# Patient Record
Sex: Female | Born: 1935 | Race: White | Hispanic: No | Smoking: Never smoker
Health system: Southern US, Community
[De-identification: ages and names within clinical notes are randomized; demographics above are authoritative.]

## PROBLEM LIST (undated history)

## (undated) DIAGNOSIS — I10 Essential (primary) hypertension: Secondary | ICD-10-CM

---

## 2019-12-23 ENCOUNTER — Other Ambulatory Visit: Payer: Self-pay

## 2019-12-23 ENCOUNTER — Emergency Department (HOSPITAL_COMMUNITY)
Admission: EM | Admit: 2019-12-23 | Discharge: 2019-12-23 | Disposition: A | Discharge status: Home / Self Care | Payer: Medicare Other | Attending: Emergency Medicine | Admitting: Emergency Medicine

## 2019-12-23 ENCOUNTER — Encounter (HOSPITAL_COMMUNITY): Payer: Self-pay

## 2019-12-23 ENCOUNTER — Emergency Department (HOSPITAL_COMMUNITY): Payer: Medicare Other

## 2019-12-23 DIAGNOSIS — S4992XA Unspecified injury of left shoulder and upper arm, initial encounter: Secondary | ICD-10-CM | POA: Diagnosis present

## 2019-12-23 DIAGNOSIS — Y929 Unspecified place or not applicable: Secondary | ICD-10-CM | POA: Insufficient documentation

## 2019-12-23 DIAGNOSIS — W1839XA Other fall on same level, initial encounter: Secondary | ICD-10-CM | POA: Insufficient documentation

## 2019-12-23 DIAGNOSIS — Y999 Unspecified external cause status: Secondary | ICD-10-CM | POA: Insufficient documentation

## 2019-12-23 DIAGNOSIS — S42202A Unspecified fracture of upper end of left humerus, initial encounter for closed fracture: Secondary | ICD-10-CM | POA: Insufficient documentation

## 2019-12-23 DIAGNOSIS — T1490XA Injury, unspecified, initial encounter: Secondary | ICD-10-CM

## 2019-12-23 DIAGNOSIS — Y9389 Activity, other specified: Secondary | ICD-10-CM | POA: Insufficient documentation

## 2019-12-23 HISTORY — DX: Essential (primary) hypertension: I10

## 2019-12-23 MED ORDER — ACETAMINOPHEN 500 MG PO TABS
1000.0000 mg | ORAL_TABLET | Freq: Once | ORAL | Status: AC
  Administered 2019-12-23: 1000 mg via ORAL
  Filled 2019-12-23: qty 2

## 2019-12-23 MED ORDER — DICLOFENAC SODIUM 1 % EX GEL: 4.0000 g | Freq: Four times a day (QID) | CUTANEOUS | 0 refills | Status: AC

## 2019-12-23 NOTE — ED Provider Notes (Signed)
[facility] DEPT Provider Note   CSN: [id number] Arrival date & time: 12/23/19  1611     History Chief Complaint  Patient presents with  . Fall    Left Shoulder Pain     Joan Porter is a 86 y.o. female.  86 yo F with a chief complaints of a nonsyncopal fall.  The patient was reaching to pick up a towel at the bottom of her shower and she lost balance and struck her left shoulder against the side of the shower.  Denies head injury denies loss of consciousness denies neck pain back pain chest pain abdominal pain or other extremity pain.  Normally ambulates with a walker but was unable to due to the pain to her left shoulder.  Describes it as sharp.  States is not too bad.  The history is provided by the patient.  Fall This is a new problem. The current episode started yesterday. The problem occurs constantly. The problem has not changed since onset.Pertinent negatives include no chest pain, no headaches and no shortness of breath. Nothing aggravates the symptoms. Nothing relieves the symptoms. She has tried nothing for the symptoms. The treatment provided no relief.       Past Medical History:  Diagnosis Date  . Hypertension     There are no problems to display for this patient.   History reviewed. No pertinent surgical history.   OB History   No obstetric history on file.     No family history on file.  Social History   Tobacco Use  . Smoking status: Not on file  Substance Use Topics  . Alcohol use: Not on file  . Drug use: Not on file    Home Medications Prior to Admission medications   Medication Sig Start Date End Date Taking? Authorizing Provider  diclofenac Sodium (VOLTAREN) 1 % GEL Apply 4 g topically 4 (four) times daily. 12/23/19   Deno Etienne, DO    Allergies    Patient has no allergy information on record.  Review of Systems   Review of Systems  Constitutional: Negative for chills and fever.  HENT: Negative for  congestion and rhinorrhea.   Eyes: Negative for redness and visual disturbance.  Respiratory: Negative for shortness of breath and wheezing.   Cardiovascular: Negative for chest pain and palpitations.  Gastrointestinal: Negative for nausea and vomiting.  Genitourinary: Negative for dysuria and urgency.  Musculoskeletal: Positive for arthralgias. Negative for myalgias.  Skin: Negative for pallor and wound.  Neurological: Negative for dizziness and headaches.    Physical Exam Updated Vital Signs BP (!) 152/103   Pulse 87   Resp 18   SpO2 94%   Physical Exam Vitals and nursing note reviewed.  Constitutional:      General: She is not in acute distress.    Appearance: She is well-developed. She is not diaphoretic.  HENT:     Head: Normocephalic and atraumatic.  Eyes:     Pupils: Pupils are equal, round, and reactive to light.  Cardiovascular:     Rate and Rhythm: Normal rate and regular rhythm.     Heart sounds: No murmur. No friction rub. No gallop.   Pulmonary:     Effort: Pulmonary effort is normal.     Breath sounds: No wheezing or rales.  Abdominal:     General: There is no distension.     Palpations: Abdomen is soft.     Tenderness: There is no abdominal tenderness.  Musculoskeletal:  General: Tenderness present.     Cervical back: Normal range of motion and neck supple.     Comments: Tenderness of the proximal humerus.  Full range of motion of the elbow without pain.  Pulse motor and sensation intact distally.  No clavicular tenderness.  Skin:    General: Skin is warm and dry.  Neurological:     Mental Status: She is alert and oriented to person, place, and time.  Psychiatric:        Behavior: Behavior normal.     ED Results / Procedures / Treatments   Labs (all labs ordered are listed, but only abnormal results are displayed) Labs Reviewed - No data to display  EKG None  Radiology DG Shoulder Left  Result Date: 12/23/2019 CLINICAL DATA:  Larey Seat, left  arm pain EXAM: LEFT SHOULDER - 2+ VIEW COMPARISON:  None. FINDINGS: Internal rotation, external rotation, and transscapular views of the left shoulder demonstrate a comminuted oblique impacted fracture of the humeral neck. There is no dislocation. Overlying soft tissue swelling. Left chest is clear. IMPRESSION: 1. Comminuted impacted left humeral neck fracture. Electronically Signed   By: Sharlet Salina M.D.   On: 12/23/2019 17:17   DG Humerus Left  Result Date: 12/23/2019 CLINICAL DATA:  Fell, pain EXAM: LEFT HUMERUS - 2+ VIEW COMPARISON:  None. FINDINGS: Frontal and lateral views of the left humerus are obtained. There is an oblique comminuted impacted fracture of the proximal humeral neck. No dislocation. Remaining portions of the left humerus are unremarkable. The soft tissues are normal. IMPRESSION: 1. Comminuted impacted left humeral neck fracture. Electronically Signed   By: Sharlet Salina M.D.   On: 12/23/2019 17:18    Procedures Procedures (including critical care time)  Medications Ordered in ED Medications  acetaminophen (TYLENOL) tablet 1,000 mg (has no administration in time range)    ED Course  I have reviewed the triage vital signs and the nursing notes.  Pertinent labs & imaging results that were available during my care of the patient were reviewed by me and considered in my medical decision making (see chart for details).    MDM Rules/Calculators/A&P                      86 yo F with a chief complaints of left arm pain.  Nonsyncopal fall.  No other injuries.  Plain film viewed by me with a proximal humerus fracture.  Placed in a sling.  Ortho follow-up.  Discussed mobility at home.  Patient already has a wheelchair.  Plain film is consistent with my initial most likely diagnosis.  Viewed on x-ray by me.  Some displacement is noticed.  Will place in a sling.  Orthopedic follow-up.  5:39 PM:  I have discussed the diagnosis/risks/treatment options with the patient and  believe the pt to be eligible for discharge home to follow-up with PCP. We also discussed returning to the ED immediately if new or worsening sx occur. We discussed the sx which are most concerning (e.g., sudden worsening pain, fever, inability to tolerate by mouth) that necessitate immediate return. Medications administered to the patient during their visit and any new prescriptions provided to the patient are listed below.  Medications given during this visit Medications  acetaminophen (TYLENOL) tablet 1,000 mg (has no administration in time range)     The patient appears reasonably screen and/or stabilized for discharge and I doubt any other medical condition or other [facility] requiring further screening, evaluation, or treatment in the ED  at this time prior to discharge.   Final Clinical Impression(s) / ED Diagnoses Final diagnoses:  Closed fracture of proximal end of left humerus, unspecified fracture morphology, initial encounter    Rx / DC Orders ED Discharge Orders         Ordered    diclofenac Sodium (VOLTAREN) 1 % GEL  4 times daily     12/23/19 1738           Melene Plan, DO 12/23/19 1740

## 2019-12-23 NOTE — Progress Notes (Addendum)
TOC CM received referral to arrange [facility]. TOC CM spoke to pt's dtr, Demita Odem. Offered choice for [facility]. Requesting [facility]. Referral sent to [facility] for HH. ED provider updated and orders requested. Isidoro Donning RN CCM, [facility] ED TOC CM [phone]  12/24/2019 2:18 pm [facility] declined referral. Referral sent to [facility] for HHPT. Isidoro Donning RN CCM, [facility] ED TOC CM [phone]  12/24/2019 530 pm [facility] accepted referral for HH. Isidoro Donning RN CCM, [facility] ED TOC CM [phone]

## 2019-12-23 NOTE — ED Notes (Signed)
Pt verbalizes understanding of DC instructions. Pt belongings returned and assisted in wheelchair out of ED.  

## 2019-12-23 NOTE — ED Triage Notes (Signed)
Pt arrived via [facility] from home CC left shoulder/arm pain r/t mechanical fall today (forward and hit edge of tub with arm). Pt denies LOC head neck or beck pain. Pt states not on blood thinners.   Per EMS A/OX4    Hx pancreatitis, HTN, StentsX3. Baseline assist with wheelchair

## 2019-12-23 NOTE — Discharge Instructions (Signed)
Take tylenol 1000mg (2 extra strength) four times a day.   Use the gel as prescribed.  Please follow-up with the orthopedic doctor in the office.  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  This comes from prolonged immobilization of the shoulder joint.  You should take your arm out of the sling and perform range of motion exercises at least 4 times a day.

## 2019-12-26 ENCOUNTER — Emergency Department (HOSPITAL_COMMUNITY): Payer: Medicare Other

## 2019-12-26 ENCOUNTER — Emergency Department (HOSPITAL_COMMUNITY)
Admission: EM | Admit: 2019-12-26 | Discharge: 2019-12-26 | Disposition: A | Discharge status: Home / Self Care | Payer: Medicare Other | Attending: Emergency Medicine | Admitting: Emergency Medicine

## 2019-12-26 DIAGNOSIS — Z79899 Other long term (current) drug therapy: Secondary | ICD-10-CM | POA: Diagnosis not present

## 2019-12-26 DIAGNOSIS — K5641 Fecal impaction: Secondary | ICD-10-CM | POA: Diagnosis not present

## 2019-12-26 DIAGNOSIS — I4891 Unspecified atrial fibrillation: Secondary | ICD-10-CM | POA: Diagnosis not present

## 2019-12-26 DIAGNOSIS — I10 Essential (primary) hypertension: Secondary | ICD-10-CM | POA: Insufficient documentation

## 2019-12-26 DIAGNOSIS — K5900 Constipation, unspecified: Secondary | ICD-10-CM | POA: Diagnosis present

## 2019-12-26 MED ORDER — MINERAL OIL RE ENEM
1.0000 | ENEMA | Freq: Once | RECTAL | Status: AC
  Administered 2019-12-26: 10:00:00 1 via RECTAL
  Filled 2019-12-26 (×2): qty 1

## 2019-12-26 NOTE — Discharge Instructions (Signed)
Hold the Imodium until bowel movements become more regular.  If you develop abdominal pain, nausea or vomiting please return to the emergency room.  Plan on following up with the specialist for the fracture in your arm and continue to take Tylenol as needed for the pain

## 2019-12-26 NOTE — ED Notes (Signed)
Mineral oil enema inserted. Patient encouraged to hold it in. Patient placed on bed pan.

## 2019-12-26 NOTE — ED Notes (Signed)
Daughter at bedside.   Patient disimpacted by EDP.  Large clay like stool collected from rectum.   Patient comfortable at this time.  Per EDP patient may drink and eat.   Patient given hot tea.

## 2019-12-26 NOTE — ED Notes (Addendum)
Patient taken off of bedpan and new brief placed.   Same output as input after enema with a small amount of diarrhea.

## 2019-12-26 NOTE — ED Triage Notes (Addendum)
Patient arrived [facility] from home.   Daughter takes care of patient.   C/O constipation X3-4 days.   Yesterday patient tried prune juice and suppository   C/O pain when she tried to have BM   Per ems this may be chronic for patient.  A/Ox4 Ambulatory with ems   Patient recently seen here for shoulder and arm pain on 4/1   Patient and daughter state patient can not take narcotics and that it will make her severely vomit.

## 2019-12-26 NOTE — ED Provider Notes (Signed)
[facility]-EMERGENCY DEPT Provider Note   CSN: [id number] Arrival date & time: 12/26/19  0746     History Chief Complaint  Patient presents with  . Constipation    Joan Porter is a 86 y.o. female.  Patient is a 86 year old female with a history of hypertension, atrial fibrillation and recent fall in her shower with a left humerus fracture on Thursday who is presenting today with complaints of constipation.  Patient is accompanied by her daughter who gives the majority of the history.  Daughter reports that patient normally has diarrhea and has to take Imodium daily because of chronic pancreatitis requiring Creon.  However since she fractured her arm she is no longer been able to stand and walk with her walker but is transferring to wheelchair and more sedentary.  She started having issues with constipation yesterday.  Daughter gave her a suppository and patient did pass some hard stool but was still having issues with constipation this morning.  Yesterday evening she was having some nausea and was unable to take Tylenol for her arm pain.  This morning she reports she is hungry and no longer nauseated.  She has no localized abdominal pain and there has been no vomiting.  She did not take any Imodium today.  She has had no urinary complaints.  She denies any shortness of breath or chest pain.  The history is provided by the patient.  Constipation Severity:  Moderate Time since last bowel movement:  2 days Timing:  Constant Progression:  Unchanged Chronicity:  New Context: not narcotics   Stool description:  Hard and pellet like Relieved by: suppository helped some but did not relieve sx. Worsened by:  Nothing Associated symptoms: anorexia and nausea   Associated symptoms: no abdominal pain, no fever and no vomiting        Past Medical History:  Diagnosis Date  . Hypertension     There are no problems to display for this patient.   History reviewed. No  pertinent surgical history.   OB History   No obstetric history on file.     No family history on file.  Social History   Tobacco Use  . Smoking status: Never Smoker  . Smokeless tobacco: Never Used  Substance Use Topics  . Alcohol use: Not on file  . Drug use: Not on file    Home Medications Prior to Admission medications   Medication Sig Start Date End Date Taking? Authorizing Provider  diclofenac Sodium (VOLTAREN) 1 % GEL Apply 4 g topically 4 (four) times daily. 12/23/19   Melene Plan, DO    Allergies    Patient has no allergy information on record.  Review of Systems   Review of Systems  Constitutional: Negative for fever.  Gastrointestinal: Positive for anorexia, constipation and nausea. Negative for abdominal pain and vomiting.  All other systems reviewed and are negative.   Physical Exam Updated Vital Signs BP 137/79   Pulse (!) 112   Temp 97.6 F (36.4 C) (Oral)   Resp 16   Ht 5' (1.524 m)   Wt 39.5 kg   SpO2 95%   BMI 16.99 kg/m   Physical Exam Vitals and nursing note reviewed.  Constitutional:      General: She is not in acute distress.    Appearance: She is well-developed and underweight.  HENT:     Head: Normocephalic and atraumatic.  Eyes:     Conjunctiva/sclera: Conjunctivae normal.     Pupils: Pupils are equal,  round, and reactive to light.  Cardiovascular:     Rate and Rhythm: Normal rate. Rhythm irregularly irregular.     Pulses: Normal pulses.     Heart sounds: No murmur.  Pulmonary:     Effort: Pulmonary effort is normal. No respiratory distress.     Breath sounds: Normal breath sounds. No wheezing or rales.  Abdominal:     General: There is no distension.     Palpations: Abdomen is soft.     Tenderness: There is no abdominal tenderness. There is no guarding or rebound.  Genitourinary:    Comments: Rectum without evidence of hemorrhoids.  Fecal impaction present and large amount of soft stool removed.  No blood  present. Musculoskeletal:        General: No tenderness. Normal range of motion.     Cervical back: Normal range of motion and neck supple.     Right lower leg: No edema.     Left lower leg: No edema.  Skin:    General: Skin is warm and dry.     Capillary Refill: Capillary refill takes less than 2 seconds.     Findings: No erythema or rash.  Neurological:     Mental Status: She is alert and oriented to person, place, and time. Mental status is at baseline.  Psychiatric:        Mood and Affect: Mood normal.        Behavior: Behavior normal.        Thought Content: Thought content normal.     ED Results / Procedures / Treatments   Labs (all labs ordered are listed, but only abnormal results are displayed) Labs Reviewed - No data to display  EKG None  Radiology DG Abdomen 1 View  Result Date: 12/26/2019 CLINICAL DATA:  Constipation EXAM: ABDOMEN - 1 VIEW COMPARISON:  None. FINDINGS: There is moderate stool in the colon. There is no bowel dilatation or air-fluid level to suggest bowel obstruction. No free air seen on this supine examination. There is aortic atherosclerosis as well as multiple foci pelvic arterial vascular calcification. Status post total hip replacement right. Evidence of old trauma with remodeling in the left superior pubic ramus and left ischium. Bones are osteoporotic. IMPRESSION: Moderate stool in colon. No bowel obstruction or free air. Evidence of prior pelvic trauma. Status post total hip replacement. Aortic Atherosclerosis (ICD10-I70.0). Multiple foci of pelvic arterial atherosclerotic vascular calcification noted. Electronically Signed   By: Lowella Grip III M.D.   On: 12/26/2019 09:09    Procedures Procedures (including critical care time)  Medications Ordered in ED Medications - No data to display  ED Course  I have reviewed the triage vital signs and the nursing notes.  Pertinent labs & imaging results that were available during my care of the  patient were reviewed by me and considered in my medical decision making (see chart for details).    MDM Rules/Calculators/A&P                      Elderly female presenting today with constipation after recent humerus fracture and fall in the shower.  Patient has not been taking narcotics but has been more sedentary which is most likely the cause of her constipation.  She normally has diarrhea and takes Imodium daily.  Patient has no localized abdominal tenderness and is otherwise well-appearing.  Daughter reported that she did have some nausea last night and would not take Tylenol but this morning she stating she  is hungry.  Patient is in atrial fibrillation today but daughter states that that is known this is not a new finding.  After a large amount of stool was removed from the rectum KUB will be done to ensure there is not significant stool that requires an enema.  Will p.o. challenge the patient. Patient's KUB shows moderate stool in the colon but no evidence of bowel obstruction.  We will do an enema just to ensure there is no further stool that needs to be evacuated prior to discharge.  Patient has been drinking fluids and is having no nausea at this time.  10:54 AM No further stool after enema.  Patient has no complaints at this time and states she feels good.  Vital signs are reassuring discussed this with her daughter him will discharge home.  She will hold Imodium at this time until bowel movements become more regular.  MDM Number of Diagnoses or Management Options   Amount and/or Complexity of Data Reviewed Tests in the radiology section of CPT: ordered and reviewed Decide to obtain previous medical records or to obtain history from someone other than the patient: yes Obtain history from someone other than the patient: yes Review and summarize past medical records: yes Discuss the patient with other providers: no Independent visualization of images, tracings, or specimens:  yes  Risk of Complications, Morbidity, and/or Mortality Presenting problems: low Diagnostic procedures: minimal Management options: low  Patient Progress Patient progress: improved    Final Clinical Impression(s) / ED Diagnoses Final diagnoses:  Fecal impaction in rectum [facility])    Rx / DC Orders ED Discharge Orders    None       Gwyneth Sprout, MD 12/26/19 1054

## 2019-12-29 ENCOUNTER — Telehealth: Payer: Self-pay | Admitting: *Deleted

## 2019-12-29 NOTE — Telephone Encounter (Signed)
TOC CM received call from Dtr, Demita and states she works with [facility]. She has spoken with her branch and they will accept referral. Contacted [facility] rep, Clydie Braun to make her aware. Isidoro Donning RN CCM, [facility] ED TOC CM [phone]

## 2020-03-24 ENCOUNTER — Emergency Department (HOSPITAL_COMMUNITY): Payer: Medicare Other

## 2020-03-24 ENCOUNTER — Other Ambulatory Visit: Payer: Self-pay

## 2020-03-24 ENCOUNTER — Encounter (HOSPITAL_COMMUNITY): Payer: Self-pay

## 2020-03-24 ENCOUNTER — Emergency Department (HOSPITAL_COMMUNITY)
Admission: EM | Admit: 2020-03-24 | Discharge: 2020-03-24 | Disposition: A | Discharge status: Home / Self Care | Payer: Medicare Other | Attending: Emergency Medicine | Admitting: Emergency Medicine

## 2020-03-24 DIAGNOSIS — E860 Dehydration: Secondary | ICD-10-CM

## 2020-03-24 DIAGNOSIS — Z79899 Other long term (current) drug therapy: Secondary | ICD-10-CM | POA: Insufficient documentation

## 2020-03-24 DIAGNOSIS — I10 Essential (primary) hypertension: Secondary | ICD-10-CM | POA: Insufficient documentation

## 2020-03-24 DIAGNOSIS — R531 Weakness: Secondary | ICD-10-CM | POA: Diagnosis present

## 2020-03-24 DIAGNOSIS — R1010 Upper abdominal pain, unspecified: Secondary | ICD-10-CM | POA: Diagnosis not present

## 2020-03-24 DIAGNOSIS — R5381 Other malaise: Secondary | ICD-10-CM

## 2020-03-24 DIAGNOSIS — Z20822 Contact with and (suspected) exposure to covid-19: Secondary | ICD-10-CM | POA: Insufficient documentation

## 2020-03-24 LAB — COMPREHENSIVE METABOLIC PANEL
ALT: 14 U/L (ref 0–44)
AST: 28 U/L (ref 15–41)
Albumin: 2.9 g/dL — ABNORMAL LOW (ref 3.5–5.0)
Alkaline Phosphatase: 68 U/L (ref 38–126)
Anion gap: 13 (ref 5–15)
BUN: 25 mg/dL — ABNORMAL HIGH (ref 8–23)
CO2: 25 mmol/L (ref 22–32)
Calcium: 8.5 mg/dL — ABNORMAL LOW (ref 8.9–10.3)
Chloride: 100 mmol/L (ref 98–111)
Creatinine, Ser: 0.99 mg/dL (ref 0.44–1.00)
GFR calc Af Amer: 54 mL/min — ABNORMAL LOW (ref >60)
GFR calc non Af Amer: 46 mL/min — ABNORMAL LOW (ref >60)
Glucose, Bld: 66 mg/dL — ABNORMAL LOW (ref 70–99)
Potassium: 4 mmol/L (ref 3.5–5.1)
Sodium: 138 mmol/L (ref 135–145)
Total Bilirubin: 0.9 mg/dL (ref 0.3–1.2)
Total Protein: 6 g/dL — ABNORMAL LOW (ref 6.5–8.1)

## 2020-03-24 LAB — URINALYSIS, ROUTINE W REFLEX MICROSCOPIC
Bilirubin Urine: NEGATIVE
Glucose, UA: NEGATIVE mg/dL
Hgb urine dipstick: NEGATIVE
Ketones, ur: 5 mg/dL — AB
Leukocytes,Ua: NEGATIVE
Nitrite: NEGATIVE
Protein, ur: NEGATIVE mg/dL
Specific Gravity, Urine: 1.013 (ref 1.005–1.030)
pH: 5 (ref 5.0–8.0)

## 2020-03-24 LAB — CBC WITH DIFFERENTIAL/PLATELET
Abs Immature Granulocytes: 0.03 10*3/uL (ref 0.00–0.07)
Basophils Absolute: 0 10*3/uL (ref 0.0–0.1)
Basophils Relative: 0 %
Eosinophils Absolute: 0 10*3/uL (ref 0.0–0.5)
Eosinophils Relative: 0 %
HCT: 33.9 % — ABNORMAL LOW (ref 36.0–46.0)
Hemoglobin: 11 g/dL — ABNORMAL LOW (ref 12.0–15.0)
Immature Granulocytes: 1 %
Lymphocytes Relative: 11 %
Lymphs Abs: 0.7 10*3/uL (ref 0.7–4.0)
MCH: 34.6 pg — ABNORMAL HIGH (ref 26.0–34.0)
MCHC: 32.4 g/dL (ref 30.0–36.0)
MCV: 106.6 fL — ABNORMAL HIGH (ref 80.0–100.0)
Monocytes Absolute: 0.4 10*3/uL (ref 0.1–1.0)
Monocytes Relative: 7 %
Neutro Abs: 5.2 10*3/uL (ref 1.7–7.7)
Neutrophils Relative %: 81 %
Platelets: 206 10*3/uL (ref 150–400)
RBC: 3.18 MIL/uL — ABNORMAL LOW (ref 3.87–5.11)
RDW: 14.4 % (ref 11.5–15.5)
WBC: 6.4 10*3/uL (ref 4.0–10.5)
nRBC: 0 % (ref 0.0–0.2)

## 2020-03-24 LAB — CBG MONITORING, ED
Glucose-Capillary: 45 mg/dL — ABNORMAL LOW (ref 70–99)
Glucose-Capillary: 57 mg/dL — ABNORMAL LOW (ref 70–99)
Glucose-Capillary: 69 mg/dL — ABNORMAL LOW (ref 70–99)
Glucose-Capillary: 80 mg/dL (ref 70–99)

## 2020-03-24 LAB — BLOOD GAS, VENOUS
Acid-Base Excess: 1.4 mmol/L (ref 0.0–2.0)
Bicarbonate: 27.4 mmol/L (ref 20.0–28.0)
O2 Saturation: 44 %
Patient temperature: 98.6
pCO2, Ven: 52.7 mmHg (ref 44.0–60.0)
pH, Ven: 7.336 (ref 7.250–7.430)
pO2, Ven: 29.1 mmHg — CL (ref 32.0–45.0)

## 2020-03-24 LAB — SARS CORONAVIRUS 2 BY RT PCR (HOSPITAL ORDER, PERFORMED IN ~~LOC~~ HOSPITAL LAB): SARS Coronavirus 2: NEGATIVE

## 2020-03-24 LAB — LACTIC ACID, PLASMA: Lactic Acid, Venous: 1.2 mmol/L (ref 0.5–1.9)

## 2020-03-24 LAB — LIPASE, BLOOD: Lipase: 17 U/L (ref 11–51)

## 2020-03-24 MED ORDER — ONDANSETRON 4 MG PO TBDP
ORAL_TABLET | ORAL | Status: AC
  Administered 2020-03-24: 4 mg via ORAL
  Filled 2020-03-24: qty 1

## 2020-03-24 MED ORDER — SODIUM CHLORIDE 0.9 % IV BOLUS: 1000.0000 mL | Freq: Once | INTRAVENOUS | Status: DC

## 2020-03-24 MED ORDER — SODIUM CHLORIDE 0.9 % IV BOLUS
1000.0000 mL | Freq: Once | INTRAVENOUS | Status: AC
  Administered 2020-03-24: 1000 mL via INTRAVENOUS

## 2020-03-24 MED ORDER — ONDANSETRON 4 MG PO TBDP: 4.0000 mg | ORAL_TABLET | Freq: Once | ORAL | Status: AC

## 2020-03-24 MED ORDER — SODIUM CHLORIDE (PF) 0.9 % IJ SOLN
INTRAMUSCULAR | Status: AC
  Filled 2020-03-24: qty 50

## 2020-03-24 MED ORDER — IOHEXOL 9 MG/ML PO SOLN
ORAL | Status: AC
  Filled 2020-03-24: qty 500

## 2020-03-24 MED ORDER — IOHEXOL 300 MG/ML  SOLN
75.0000 mL | Freq: Once | INTRAMUSCULAR | Status: AC | PRN
  Administered 2020-03-24: 50 mL via INTRAVENOUS

## 2020-03-24 NOTE — ED Provider Notes (Signed)
[facility]-EMERGENCY DEPT Provider Note   CSN: [id number] Arrival date & time: 03/24/20  1226     History No chief complaint on file.   Joan Porter is a 86 y.o. female.  HPI She presents for evaluation of weakness, malaise, anorexia And multiple episodes of large-volume diarrhea.  Onset of symptoms several days ago.  She denies fever, chills, cough, shortness of breath, abdominal pain, back pain, focal weakness or paresthesia.  She is in the ED several months ago with fecal impaction, and she states that resolved and she does not have ongoing symptoms of constipation.  No known sick contacts.  She lives with her daughter.  She came here by EMS, for evaluation.  There are no other known modifying factors.   Past Medical History:  Diagnosis Date  . Hypertension     There are no problems to display for this patient.   History reviewed. No pertinent surgical history.   OB History   No obstetric history on file.     History reviewed. No pertinent family history.  Social History   Tobacco Use  . Smoking status: Never Smoker  . Smokeless tobacco: Never Used  Substance Use Topics  . Alcohol use: Not on file  . Drug use: Not on file    Home Medications Prior to Admission medications   Medication Sig Start Date End Date Taking? Authorizing Provider  acetaminophen (TYLENOL) 325 MG tablet Take 650 mg by mouth daily as needed for mild pain.   Yes [provider]  b complex vitamins tablet Take 1 tablet by mouth daily.   Yes [provider]  Calcium Carb-Cholecalciferol (CALCIUM 500 + D) 500-200 MG-UNIT TABS Take 1 tablet by mouth in the morning and at bedtime.   Yes [provider]  Cholecalciferol (VITAMIN D-3) 25 MCG (1000 UT) CAPS Take 1,000 Units by mouth daily.   Yes [provider]  glucosamine-chondroitin 500-400 MG tablet Take 1 tablet by mouth in the morning and at bedtime.   Yes [provider]    hydrochlorothiazide (HYDRODIURIL) 25 MG tablet Take 25 mg by mouth daily.   Yes [provider]  isosorbide mononitrate (IMDUR) 60 MG 24 hr tablet Take 60 mg by mouth daily.   Yes [provider]  latanoprost (XALATAN) 0.005 % ophthalmic solution Place 1 drop into both eyes at bedtime.   Yes [provider]  lisinopril (ZESTRIL) 40 MG tablet Take 40 mg by mouth daily.   Yes [provider]  loperamide (IMODIUM A-D) 2 MG tablet Take 2 mg by mouth daily as needed for diarrhea or loose stools.   Yes [provider]  metoprolol tartrate (LOPRESSOR) 25 MG tablet Take 12.5 mg by mouth daily.   Yes [provider]  Multiple Vitamin (MULTIVITAMIN) tablet Take 1 tablet by mouth daily.   Yes [provider]  nitroGLYCERIN (NITROSTAT) 0.4 MG SL tablet Place 0.4 mg under the tongue every 5 (five) minutes as needed for chest pain.   Yes [provider]  omega-3 fish oil (MAXEPA) 1000 MG CAPS capsule Take 1 capsule by mouth 2 (two) times daily.   Yes [provider]  Pancrelipase, Lip-Prot-Amyl, (CREON) 24000-76000 units CPEP Take 1 capsule by mouth 3 (three) times daily with meals.   Yes [provider]  pantoprazole (PROTONIX) 40 MG tablet Take 40 mg by mouth daily.   Yes [provider]  vitamin C (ASCORBIC ACID) 250 MG tablet Take 500 mg by mouth daily.  Yes [provider]  diclofenac Sodium (VOLTAREN) 1 % GEL Apply 4 g topically 4 (four) times daily. Patient not taking: Reported on 03/24/2020 12/23/19   Melene Plan, DO    Allergies    Other  Review of Systems   Review of Systems  All other systems reviewed and are negative.   Physical Exam Updated Vital Signs BP (!) 141/78   Pulse 87   Temp 97.7 F (36.5 C) (Oral)   Resp 16   SpO2 97%   Physical Exam Vitals and nursing note reviewed.  Constitutional:      General: She is not in acute distress.    Appearance: She is well-developed.  She is ill-appearing. She is not toxic-appearing or diaphoretic.     Comments: Frail, elderly  HENT:     Head: Normocephalic and atraumatic.     Right Ear: External ear normal.     Left Ear: External ear normal.     Mouth/Throat:     Mouth: Mucous membranes are moist.     Pharynx: Oropharynx is clear. No oropharyngeal exudate or posterior oropharyngeal erythema.  Eyes:     Conjunctiva/sclera: Conjunctivae normal.     Pupils: Pupils are equal, round, and reactive to light.  Neck:     Trachea: Phonation normal.  Cardiovascular:     Rate and Rhythm: Normal rate and regular rhythm.     Heart sounds: Normal heart sounds.  Pulmonary:     Effort: Pulmonary effort is normal.     Breath sounds: Normal breath sounds.  Abdominal:     General: There is no distension.     Palpations: Abdomen is soft.     Tenderness: There is abdominal tenderness (Mid upper, mild).  Musculoskeletal:        General: Normal range of motion.     Cervical back: Normal range of motion and neck supple.  Skin:    General: Skin is warm and dry.  Neurological:     Mental Status: She is alert and oriented to person, place, and time.     Cranial Nerves: No cranial nerve deficit.     Sensory: No sensory deficit.     Motor: No abnormal muscle tone.     Coordination: Coordination normal.     Comments: No dysarthria, or aphasia.  She is lucid.  Psychiatric:        Mood and Affect: Mood normal.        Behavior: Behavior normal.        Thought Content: Thought content normal.        Judgment: Judgment normal.     ED Results / Procedures / Treatments   Labs (all labs ordered are listed, but only abnormal results are displayed) Labs Reviewed  COMPREHENSIVE METABOLIC PANEL - Abnormal; Notable for the following components:      Result Value   Glucose, Bld 66 (*)    BUN 25 (*)    Calcium 8.5 (*)    Total Protein 6.0 (*)    Albumin 2.9 (*)    GFR calc non Af Amer 46 (*)    GFR calc Af Amer 54 (*)    All other  components within normal limits  CBC WITH DIFFERENTIAL/PLATELET - Abnormal; Notable for the following components:   RBC 3.18 (*)    Hemoglobin 11.0 (*)    HCT 33.9 (*)    MCV 106.6 (*)    MCH 34.6 (*)    All other components within normal limits  BLOOD GAS,  VENOUS - Abnormal; Notable for the following components:   pO2, Ven 29.1 (*)    All other components within normal limits  URINALYSIS, ROUTINE W REFLEX MICROSCOPIC - Abnormal; Notable for the following components:   Ketones, ur 5 (*)    All other components within normal limits  CBG MONITORING, ED - Abnormal; Notable for the following components:   Glucose-Capillary 45 (*)    All other components within normal limits  CBG MONITORING, ED - Abnormal; Notable for the following components:   Glucose-Capillary 57 (*)    All other components within normal limits  CBG MONITORING, ED - Abnormal; Notable for the following components:   Glucose-Capillary 69 (*)    All other components within normal limits  SARS CORONAVIRUS 2 BY RT PCR (HOSPITAL ORDER, PERFORMED IN [facility] LAB)  LIPASE, BLOOD  LACTIC ACID, PLASMA  CBG MONITORING, ED    EKG EKG Interpretation  Date/Time:  Friday March 24 2020 12:49:32 EDT Ventricular Rate:  76 PR Interval:    QRS Duration: 82 QT Interval:  408 QTC Calculation: 444 R Axis:   -33 Text Interpretation: Atrial fibrillation Left axis deviation Anteroseptal infarct, age indeterminate No old tracing to compare Confirmed by Mancel Bale [phone]) on 03/24/2020 4:34:30 PM   Radiology DG Chest 2 View  Result Date: 03/24/2020 CLINICAL DATA:  Nausea and vomiting for several days EXAM: CHEST - 2 VIEW COMPARISON:  None. FINDINGS: Cardiac shadow is within normal limits. Aortic calcifications are noted. Lungs are well aerated bilaterally. No focal infiltrate is seen. No bony is noted. IMPRESSION: No acute abnormality noted. Electronically Signed   By: Alcide Clever M.D.   On: 03/24/2020 14:36   CT  Abdomen Pelvis W Contrast  Result Date: 03/24/2020 CLINICAL DATA:  86 year old with progressive weakness. Generalized pain, nausea and vomiting. Abdominal abscess/infection suspected EXAM: CT ABDOMEN AND PELVIS WITH CONTRAST TECHNIQUE: Multidetector CT imaging of the abdomen and pelvis was performed using the standard protocol following bolus administration of intravenous contrast. CONTRAST:  26mL OMNIPAQUE IOHEXOL 300 MG/ML  SOLN COMPARISON:  None. FINDINGS: Lower chest: Upper normal heart size with coronary artery calcifications. Subpleural reticular opacities at the lung bases with trace pleural effusions. Hepatobiliary: Punctate hepatic granulomas. Mild gallbladder distension with question of pericholecystic fat stranding. There is no calcified gallstone. Mild central intrahepatic biliary ductal dilatation with common bile duct grossly normal, not well-defined on the current exam. Pancreas: Parenchymal atrophy. Cysts abutting the mid body span approximately 2.5 cm, best appreciated on coronal reformat series 5, image 29. No peripancreatic fat stranding. No ductal dilatation. Spleen: Normal in size without focal abnormality. Adrenals/Urinary Tract: No adrenal nodule. There is bilateral renal parenchymal thinning, more so on the left. No hydronephrosis or perinephric edema. Small right greater than left renal cysts. Calcifications in the renal hila likely vascular rather than nonobstructing stones. Distended urinary bladder without wall thickening. Stomach/Bowel: Stomach is unremarkable. Duodenum is slightly distended but no wall thickening. Administered enteric contrast is seen throughout the colon. No small bowel obstruction. There is no small bowel inflammation. The appendix is not well visualized, no evidence of appendicitis. Mild transverse colonic tortuosity. Moderate stool burden in the sigmoid colon. Moderate sigmoid colonic diverticulosis without diverticulitis. No abnormal rectal distention.  Vascular/Lymphatic: Advanced aortic and branch atherosclerosis. Coarse calcified plaque projects into the infrarenal aortic lumen. No evidence of acute vascular abnormality. The portal vein is patent. No adenopathy. Reproductive: Small calcified structure in the left uterus is likely a fibroid. Quiescent uterus with additional vascular  calcifications. No adnexal mass. Other: Trace perihepatic free fluid. No free air. No focal fluid collection. Musculoskeletal: Moderate L1 superior endplate compression fracture, appears chronic but no prior exams to assess for stability. Right hip arthroplasty. 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 Bones are under mineralized. No acute osseous abnormalities are seen. IMPRESSION: 1. Mild gallbladder distension with question of pericholecystic fat stranding. There is no calcified gallstone. Recommend right upper quadrant ultrasound for further evaluation. 2. Mild central intrahepatic biliary ductal dilatation with grossly normal common bile duct. 3. Colonic diverticulosis without diverticulitis. 4. Distended urinary bladder without wall thickening. 5. Moderate L1 superior endplate compression fracture, appears chronic but no prior exams to assess for stability. 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 Aortic Atherosclerosis (ICD10-I70.0). Electronically Signed   By: Narda Rutherford M.D.   On: 03/24/2020 20:02    Procedures Procedures (including critical care time)  Medications Ordered in ED Medications  sodium chloride (PF) 0.9 % injection (has no administration in time range)  iohexol (OMNIPAQUE) 9 MG/ML oral solution (has no administration in time range)  sodium chloride 0.9 % bolus 1,000 mL (0 mLs Intravenous Stopped 03/24/20 1530)  ondansetron (ZOFRAN-ODT) disintegrating tablet 4 mg (4 mg Oral Given 03/24/20 1729)  iohexol (OMNIPAQUE) 300 MG/ML solution 75 mL (50 mLs Intravenous Contrast Given  03/24/20 1935)  sodium chloride 0.9 % bolus 1,000 mL (1,000 mLs Intravenous New Bag/Given (Non-Interop) 03/24/20 1955)    ED Course  I have reviewed the triage vital signs and the nursing notes.  Pertinent labs & imaging results that were available during my care of the patient were reviewed by me and considered in my medical decision making (see chart for details).  Clinical Course as of Mar 24 2126  Fri Mar 24, 2020  1839 Abnormal, low, 57 at 1812  CBG monitoring, ED(!) [EW]  1840 Normal except glucose low, BUN high, calcium low, total protein low, albumin low, GFR low  Comprehensive metabolic panel(!) [EW]  1840 Normal  Lipase, blood [EW]  1841 Normal  SARS Coronavirus 2 by RT PCR (hospital order, performed in [facility] hospital lab) Nasopharyngeal Nasopharyngeal Swab [EW]  1841 Normal venous gas  Blood gas, venous(!!) [EW]  1841 Normal  Lactic acid, plasma [EW]  1841 Normal except hemoglobin low MCV high   [EW]  1841 No infiltrate or CHF, interpreted by me  DG Chest 2 View [EW]  1851 Patient is alert and appears comfortable.  She is conversant and states she is ready to go home.  Patient's daughter is now in the room and I gave her an update on the findings and planning.  Daughter understands patient needs to get IV access CT as well as urinalysis, prior to disposition.   [EW]  2014 Normal except ketones present  Urinalysis, Routine w reflex microscopic(!) [EW]  2124 CT Abdomen Pelvis W Contrast [EW]  2124 Per radiologist, abnormal, possible gallbladder disease, colonic diverticulosis, distended urinary bladder, L1 endplate compression fracture, remote pelvic fracture with nonunion   [EW]    Clinical Course User Index [EW] Mancel Bale, MD   MDM Rules/Calculators/A&P                           Patient Vitals for the past 24 hrs:  BP Temp Temp src Pulse Resp SpO2  03/24/20 2100 (!) 141/78 -- -- 87 16 97 %  03/24/20 2030 (!) 154/82 -- -- 88 18 97 %  03/24/20 1915 (!)  142/81 -- -- 97 16 98 %  03/24/20 1845 (!) 142/86 -- -- (!) 104 18 98 %  03/24/20 1830 (!) 156/96 -- -- (!) 102 16 99 %  03/24/20 1700 (!) 143/92 -- -- 86 14 100 %  03/24/20 1546 (!) 145/93 -- -- 84 14 100 %  03/24/20 1515 137/89 -- -- 80 16 100 %  03/24/20 1415 126/74 -- -- 82 11 97 %  03/24/20 1249 103/67 97.7 F (36.5 C) Oral 74 17 99 %    9:32 PM Reevaluation with update and discussion. After initial assessment and treatment, an updated evaluation reveals patient remains comfortable and wants to go home.  No tenderness in the upper abdomen.  Daughter with patient now stating that patient does not have postprandial pain or vomiting.  I informed the patient and her daughter, verbally of all the findings and will give them written instructions as well.  All questions were answered. Mancel BaleElliott Maycen Degregory   Medical Decision Making:  This patient is presenting for evaluation of malaise, anorexia, abdominal discomfort, which does require a range of treatment options, and is a complaint that involves a high risk of morbidity and mortality. The differential diagnoses include gastroenteritis, medication intolerance, intestinal disease including constipation, intra-abdominal infection. I decided to review old records, and in summary [facility] patient, healthy for age..  I obtained additional historical information from her daughter at the bedside.  Clinical Laboratory Tests Ordered, included CBC, Metabolic panel, Urinalysis and Lactate, Covid test. Review indicates findings with exception of glucose level, BUN elevated, T4 low, hemoglobin low. Radiologic Tests Ordered, included CT abdomen pelvis, chest x-ray.  I independently Visualized: Radiographic images, which show normal findings     Critical Interventions-clinical evaluation, after testing, imaging, observation reassessment  After These Interventions, the Patient was reevaluated and was found improved, more comfortable after IV fluids. Screening  evaluation remarkable mostly for hypoglycemia dehydration. Patient improved with IV fluids. CT does not show acute abnormalities, there is possible gallbladder disease however the patient does not have symptoms for that. No indication for further evaluation today at this time. Patient and daughter are agreeable to discharge  CRITICAL CARE-no Performed by: Mancel BaleElliott Tashiba Timoney  Nursing Notes Reviewed/ Care Coordinated Applicable Imaging Reviewed Interpretation of Laboratory Data incorporated into ED treatment  The patient appears reasonably screened and/or stabilized for discharge and I doubt any other medical condition or other [facility] requiring further screening, evaluation, or treatment in the ED at this time prior to discharge.  Plan: Home Medications-continue usual; Home Treatments-increase oral fluid intake; return here if the recommended treatment, does not improve the symptoms; Recommended follow up-PCP checkup 1 week and as needed     Final Clinical Impression(s) / ED Diagnoses Final diagnoses:  None    Rx / DC Orders ED Discharge Orders    None       Mancel BaleWentz, Kimoni Pagliarulo, MD 03/25/20 1705

## 2020-03-24 NOTE — ED Notes (Signed)
Pt given orange juice and able to keep that down.

## 2020-03-24 NOTE — ED Triage Notes (Signed)
Pt BIB EMS from home. Pt reports progressive weakness for a few weeks now. Pt also reports generalized pain, nausea, and vomiting x5 days.  90/60 96% RA  HR 87 CBG 91

## 2020-03-24 NOTE — ED Notes (Signed)
Attempted in and out catheter twice. Only able to get 1cc of urine in the in and out catheter tube.

## 2020-03-24 NOTE — Discharge Instructions (Signed)
The testing today most likely indicates that you are dehydrated causing you to feel bad.  It is important to try to drink at least a liter of water every day along with eating 3 good meals.  The CT scan that was done showed a possible gallbladder problem however you do not really have symptoms of that.  If you do develop pain in your right upper abdomen, after eating or develop nausea and vomiting, have your doctor order an ultrasound to look at the gallbladder.  The CAT scan also showed an old injury of your L1 vertebrae, and your pelvis.

## 2020-03-24 NOTE — ED Notes (Signed)
Pts IV was halfway pulled out and bleeding, IV was removed. x3 attempts to get initial IV successfully. Pt requires Iv for CT and fluids.

## 2020-04-10 ENCOUNTER — Telehealth: Payer: Self-pay

## 2020-04-10 NOTE — Telephone Encounter (Signed)
Received request for Palliative care for patient from daughter and from [facility]. TC placed to PCP office to make PCP aware and confirm that he/she is in agreement. VM left for nurse, Kristi.

## 2020-04-25 ENCOUNTER — Telehealth: Payer: Self-pay | Admitting: Hospice

## 2020-04-25 NOTE — Telephone Encounter (Signed)
Spoke with patient's daughter, Tommie Raymond, and have scheduled an In-home Palliative Consult for 05/11/20 @ 2:30 PM

## 2020-05-11 ENCOUNTER — Other Ambulatory Visit: Payer: Self-pay

## 2020-05-11 ENCOUNTER — Other Ambulatory Visit: Payer: Medicare Other | Admitting: Hospice

## 2020-05-11 DIAGNOSIS — Z515 Encounter for palliative care: Secondary | ICD-10-CM

## 2020-05-11 NOTE — Progress Notes (Signed)
Therapist, nutritional Palliative Care Consult Note Telephone: [phone]  Fax: [phone]  PATIENT NAME: Joan Porter DOB: [birth year] MRN: [id number]  PRIMARY CARE PROVIDER:   Physicians, [facility]  REFERRING PROVIDER: Physicians, [facility]  RESPONSIBLE PARTY:  Self Contact Donita [phone] - daughter - HCPOA    RECOMMENDATIONS/PLAN:   Advance Care Planning/Goals of Care: Visit at the request of patient's daughter Tommie Raymond RN  for palliative consult. Visit consisted of building trust and discussions on Palliative Medicine as specialized medical care for people living with serious illness, aimed at facilitating better quality of life through symptoms relief, assisting with advance care plan and establishing goals of care.  Explanations given on the difference between palliative services and hospice services.  This is a joint visit of this NP Merril Abbe NP with patient.  Donita present during visit. Code Status: Patient is a DO NOT RESUSCITATE.  DNR form signed for patient today; same document uploaded to epic. Goals of Care: Goals of care include to maximize quality of life and symptom management. Extensive discussions today on medical orders for scope of treatmemt.  MOST selections include DNR, comfort measures, IV fluids as indicated, antibiotics as indicated , no feeding tube.  Daughter reiterated that no aggressive treatments needed at this time; family open to hospice services in the future when patient qualifies for it Follow up: Palliative care will continue to follow patient for goals of care clarification and symptom management.  Follow-up visit in a month Symptom management: Patient denies pain/discomfort.  She endorsed weakness limiting her ability to walk around the house as much as she would love to.  She uses a rolling walker for support.  Education provided on use of rolling walker to help prevent fall; rest in between activities  also discussed.  Patient with well-controlled hypertension, currently managed with Lisinopril, Metoprolol Imdur. Recent ED visit  for dehydration following bouts of nuasea/vomiting and diarrhea; resolved at this time. . She takes Creon BID daily Imodium for pancreatitis. Pantaprazole for GERD. Mild + 1 edema in right ankle; compression hose on; patient continues on HTCZ.  Discussion/demonstration on lower extremity elevation.  Medications managed by daughter; patient compliant with her medications.  I spent 1 hour and 20 minutes providing this consultation; time iincludes time spent with patient/family, chart review, provider coordination,  and documentation. More than 50% of the time in this consultation was spent on coordinating communication  HISTORY OF PRESENT ILLNESS:  Joan Porter is a 86 y.o. year old female with multiple medical problems including Hypertension, hx of pancreatitis, GERD. Palliative Care was asked to help address goals of care.   CODE STATUS: DNR  PPS: 40% HOSPICE ELIGIBILITY/DIAGNOSIS: TBD  PAST MEDICAL HISTORY:  Past Medical History:  Diagnosis Date  . Hypertension     SOCIAL HX:  Social History   Tobacco Use  . Smoking status: Never Smoker  . Smokeless tobacco: Never Used  Substance Use Topics  . Alcohol use: Not on file    ALLERGIES:  Allergies  Allergen Reactions  . Other     "all narcotics" per daughter  GI issues     PERTINENT MEDICATIONS:  Outpatient Encounter Medications as of 05/11/2020  Medication Sig  . acetaminophen (TYLENOL) 325 MG tablet Take 650 mg by mouth daily as needed for mild pain.  Marland Kitchen b complex vitamins tablet Take 1 tablet by mouth daily.  . Calcium Carb-Cholecalciferol (CALCIUM 500 + D) 500-200 MG-UNIT TABS Take 1 tablet by  mouth in the morning and at bedtime.  . Cholecalciferol (VITAMIN D-3) 25 MCG (1000 UT) CAPS Take 1,000 Units by mouth daily.  . diclofenac Sodium (VOLTAREN) 1 % GEL Apply 4 g topically 4 (four) times daily.  (Patient not taking: Reported on 03/24/2020)  . glucosamine-chondroitin 500-400 MG tablet Take 1 tablet by mouth in the morning and at bedtime.  . hydrochlorothiazide (HYDRODIURIL) 25 MG tablet Take 25 mg by mouth daily.  . isosorbide mononitrate (IMDUR) 60 MG 24 hr tablet Take 60 mg by mouth daily.  Marland Kitchen latanoprost (XALATAN) 0.005 % ophthalmic solution Place 1 drop into both eyes at bedtime.  Marland Kitchen lisinopril (ZESTRIL) 40 MG tablet Take 40 mg by mouth daily.  Marland Kitchen loperamide (IMODIUM A-D) 2 MG tablet Take 2 mg by mouth daily as needed for diarrhea or loose stools.  . metoprolol tartrate (LOPRESSOR) 25 MG tablet Take 12.5 mg by mouth daily.  . Multiple Vitamin (MULTIVITAMIN) tablet Take 1 tablet by mouth daily.  . nitroGLYCERIN (NITROSTAT) 0.4 MG SL tablet Place 0.4 mg under the tongue every 5 (five) minutes as needed for chest pain.  Marland Kitchen omega-3 fish oil (MAXEPA) 1000 MG CAPS capsule Take 1 capsule by mouth 2 (two) times daily.  . Pancrelipase, Lip-Prot-Amyl, (CREON) 24000-76000 units CPEP Take 1 capsule by mouth 3 (three) times daily with meals.  . pantoprazole (PROTONIX) 40 MG tablet Take 40 mg by mouth daily.  . vitamin C (ASCORBIC ACID) 250 MG tablet Take 500 mg by mouth daily.   No facility-administered encounter medications on file as of 05/11/2020.    PHYSICAL EXAM/ROS:  General: NAD, frail appearing, thin Cardiovascular: regular rate and rhythm; denies chest pain Pulmonary: clear ant/post fields, no adventitious lung sounds auscultated Abdomen: soft, nontender, + bowel sounds GU: no suprapubic tenderness Extremities: 1+ pitting edema to right ankle; elevation encouraged Skin: no rashes to exposed skin Neurological: Weakness but otherwise nonfocal  Rosaura Carpenter, NP

## 2020-05-15 ENCOUNTER — Telehealth: Payer: Self-pay

## 2020-05-15 NOTE — Telephone Encounter (Signed)
Received a phone call from patient's daughter, Suzan Slick , who shared that patient has declined over the weekend. Suzan Slick is requesting that patient be evaluated for hospice services. Spoke with [place] NP who is in agreement with this. Will reach out to PCP.

## 2020-05-16 ENCOUNTER — Telehealth: Payer: Self-pay

## 2020-05-16 NOTE — Telephone Encounter (Signed)
Daughter, Donita, updated on obtaining referral for hospice eval. Spoke with referral department. Confirmed receipt of referral and provided contact information

## 2020-05-24 DEATH — deceased

## 2021-03-31 IMAGING — CT CT ABD-PELV W/ CM
2 of 5 series · 15 of 46 positions shown, 17 images · IV contrast (omnipaque)
Comparison: None.

CLINICAL DATA: [AGE] with progressive weakness. Generalized
pain, nausea and vomiting.

Abdominal abscess/infection suspected
EXAM:
CT ABDOMEN AND PELVIS WITH CONTRAST
TECHNIQUE: Multidetector CT imaging of the abdomen and pelvis was performed
using the standard protocol following bolus administration of
intravenous contrast.
CONTRAST:  50mL OMNIPAQUE IOHEXOL 300 MG/ML  SOLN

[Series 2: axial st · axial · 0.68mm/px · z∈[+1103,+1443]mm · 12 of 80 slices shown, 14 images]
[im 6/80  soft-tissue]
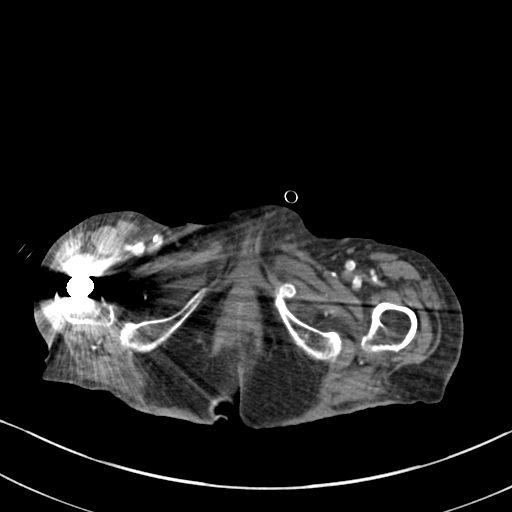
[im 6/80  bone]
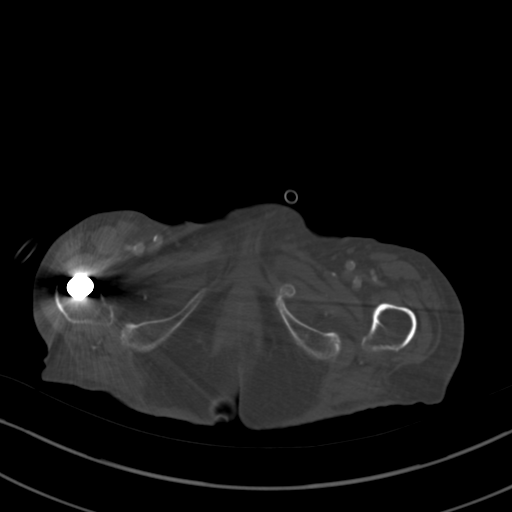
[im 11/80  soft-tissue]
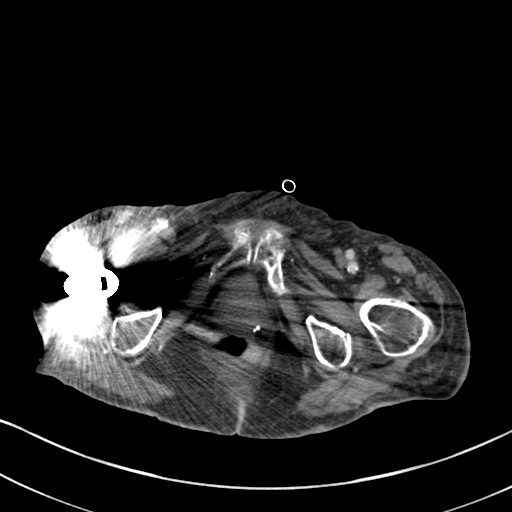
[im 16/80  soft-tissue]
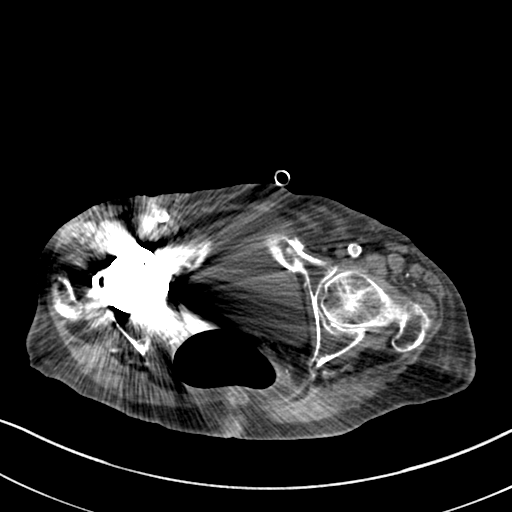
[im 27/80  soft-tissue]
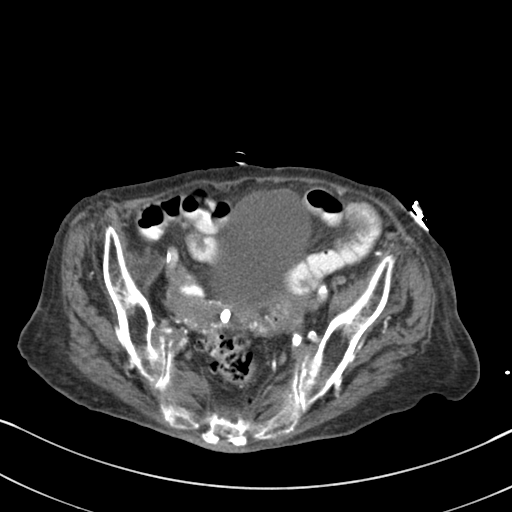
[im 32/80  soft-tissue]
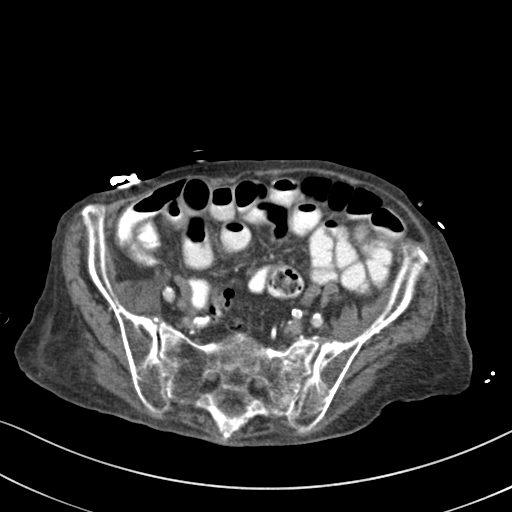
[im 37/80  soft-tissue]
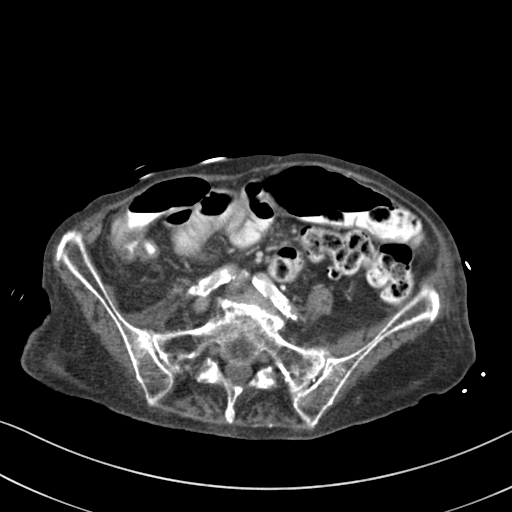
[im 43/80  soft-tissue]
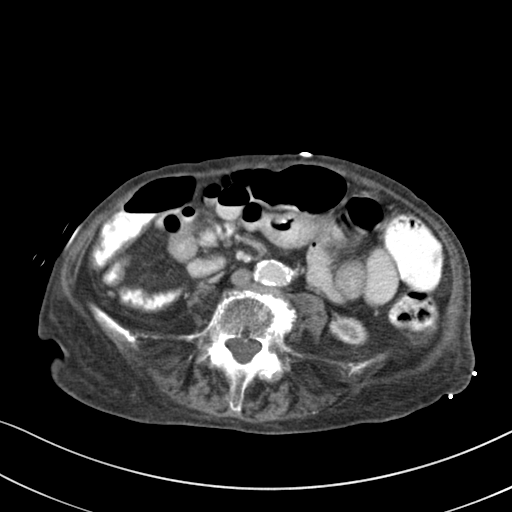
[im 48/80  soft-tissue]
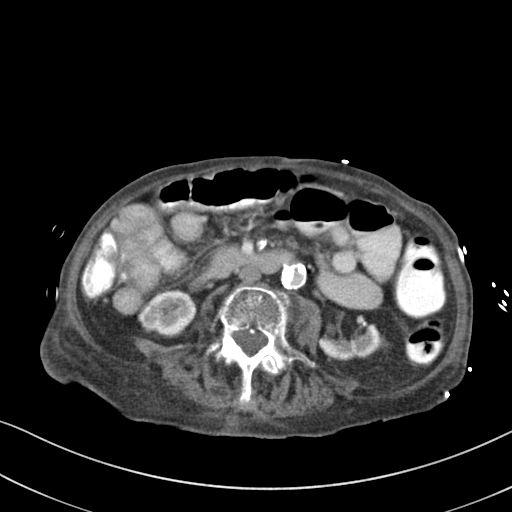
[im 53/80  soft-tissue]
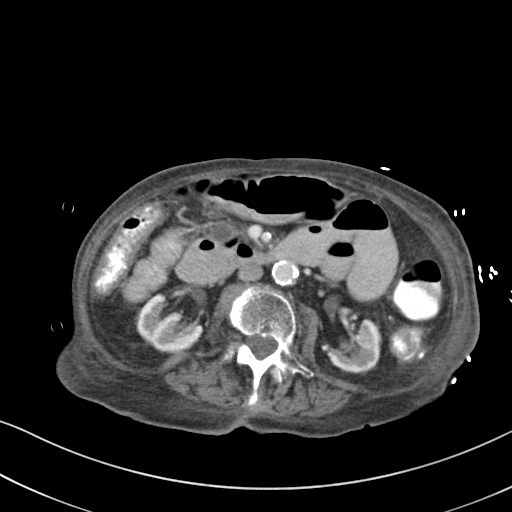
[im 53/80  bone]
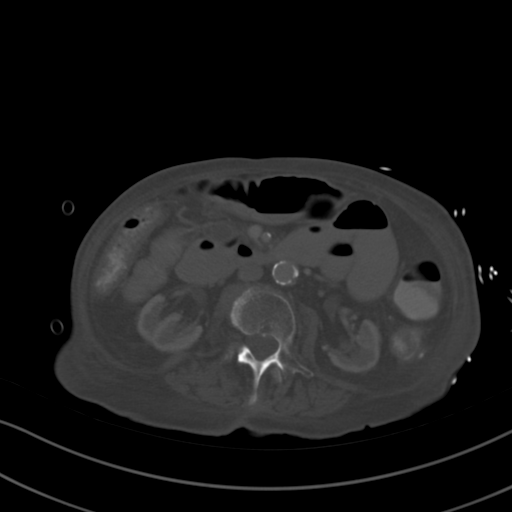
[im 64/80  soft-tissue]
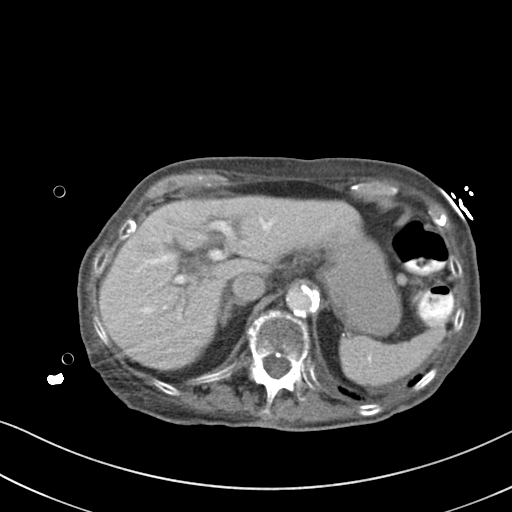
[im 69/80  soft-tissue]
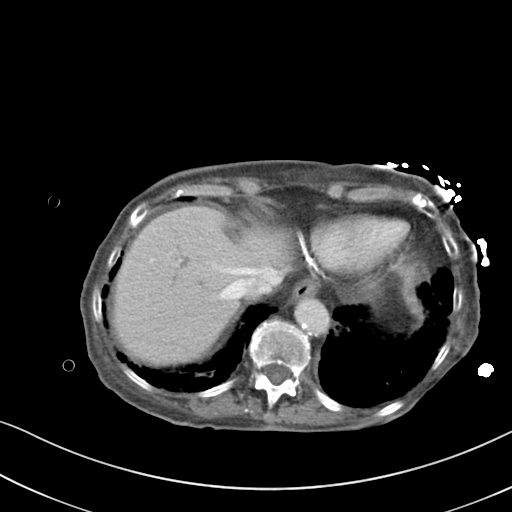
[im 74/80  soft-tissue]
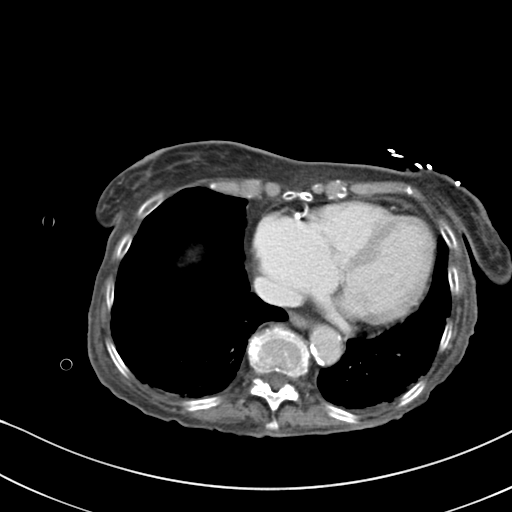

[Series 5: coronal st · coronal · 0.60mm/px · 3 of 107 slices shown]
[im 36/107  soft-tissue]
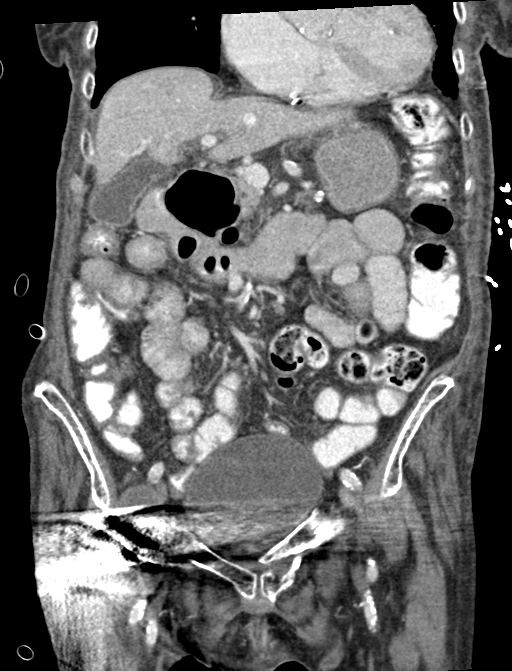
[im 48/107  soft-tissue]
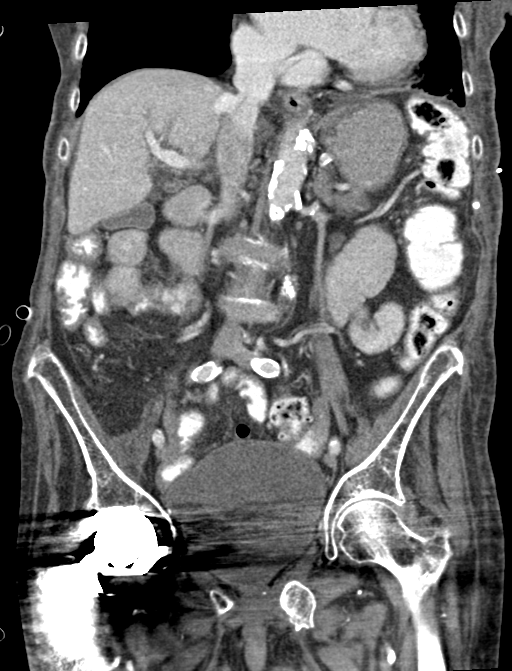
[im 59/107  soft-tissue]
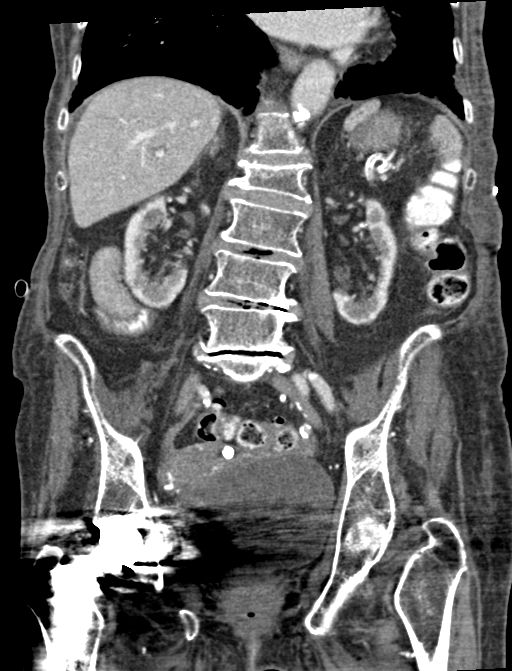

[15 of 46 positions shown; findings below may reference images not displayed]

FINDINGS: Lower chest: Upper normal heart size with coronary artery
calcifications. Subpleural reticular opacities at the lung bases
with trace pleural effusions.

Hepatobiliary: Punctate hepatic granulomas. Mild gallbladder
distension with question of pericholecystic fat stranding. There is
no calcified gallstone. Mild central intrahepatic biliary ductal
dilatation with common bile duct grossly normal, not well-defined on
the current exam.

Pancreas: Parenchymal atrophy. Cysts abutting the mid body span
approximately 2.5 cm, best appreciated on coronal reformat series 5,
image 29. No peripancreatic fat stranding. No ductal dilatation.

Spleen: Normal in size without focal abnormality.

Adrenals/Urinary Tract: No adrenal nodule. There is bilateral renal
parenchymal thinning, more so on the left. No hydronephrosis or
perinephric edema. Small right greater than left renal cysts.
Calcifications in the renal hila likely vascular rather than
nonobstructing stones. Distended urinary bladder without wall
thickening.

Stomach/Bowel: Stomach is unremarkable. Duodenum is slightly
distended but no wall thickening. Administered enteric contrast is
seen throughout the colon. No small bowel obstruction. There is no
small bowel inflammation. The appendix is not well visualized, no
evidence of appendicitis. Mild transverse colonic tortuosity.
Moderate stool burden in the sigmoid colon. Moderate sigmoid colonic
diverticulosis without diverticulitis. No abnormal rectal
distention.

Vascular/Lymphatic: Advanced aortic and branch atherosclerosis.
Coarse calcified plaque projects into the infrarenal aortic lumen.
No evidence of acute vascular abnormality. The portal vein is
patent. No adenopathy.

Reproductive: Small calcified structure in the left uterus is likely
a fibroid. Quiescent uterus with additional vascular calcifications.
No adnexal mass.

Other: Trace perihepatic free fluid. No free air. No focal fluid
collection.

Musculoskeletal: Moderate L1 superior endplate compression fracture,
appears chronic but no prior exams to assess for stability. Right
hip arthroplasty. Remote left pubic rami fractures, superior pubic
ramus fracture has nonunion. Bones are under mineralized. No acute
osseous abnormalities are seen.
IMPRESSION: 1. Mild gallbladder distension with question of pericholecystic fat
stranding. There is no calcified gallstone. Recommend right upper
quadrant ultrasound for further evaluation.
2. Mild central intrahepatic biliary ductal dilatation with grossly
normal common bile duct.
3. Colonic diverticulosis without diverticulitis.
4. Distended urinary bladder without wall thickening.
5. Moderate L1 superior endplate compression fracture, appears
chronic but no prior exams to assess for stability. Remote left
pubic rami fractures, superior pubic ramus fracture has nonunion.

Aortic Atherosclerosis ([id number]).

## 2021-03-31 IMAGING — CR DG CHEST 2V
2 series · 2 of 2 positions shown · non-contrast
Comparison: None.

CLINICAL DATA: Nausea and vomiting for several days

EXAM:
CHEST - 2 VIEW

[w chest lat]
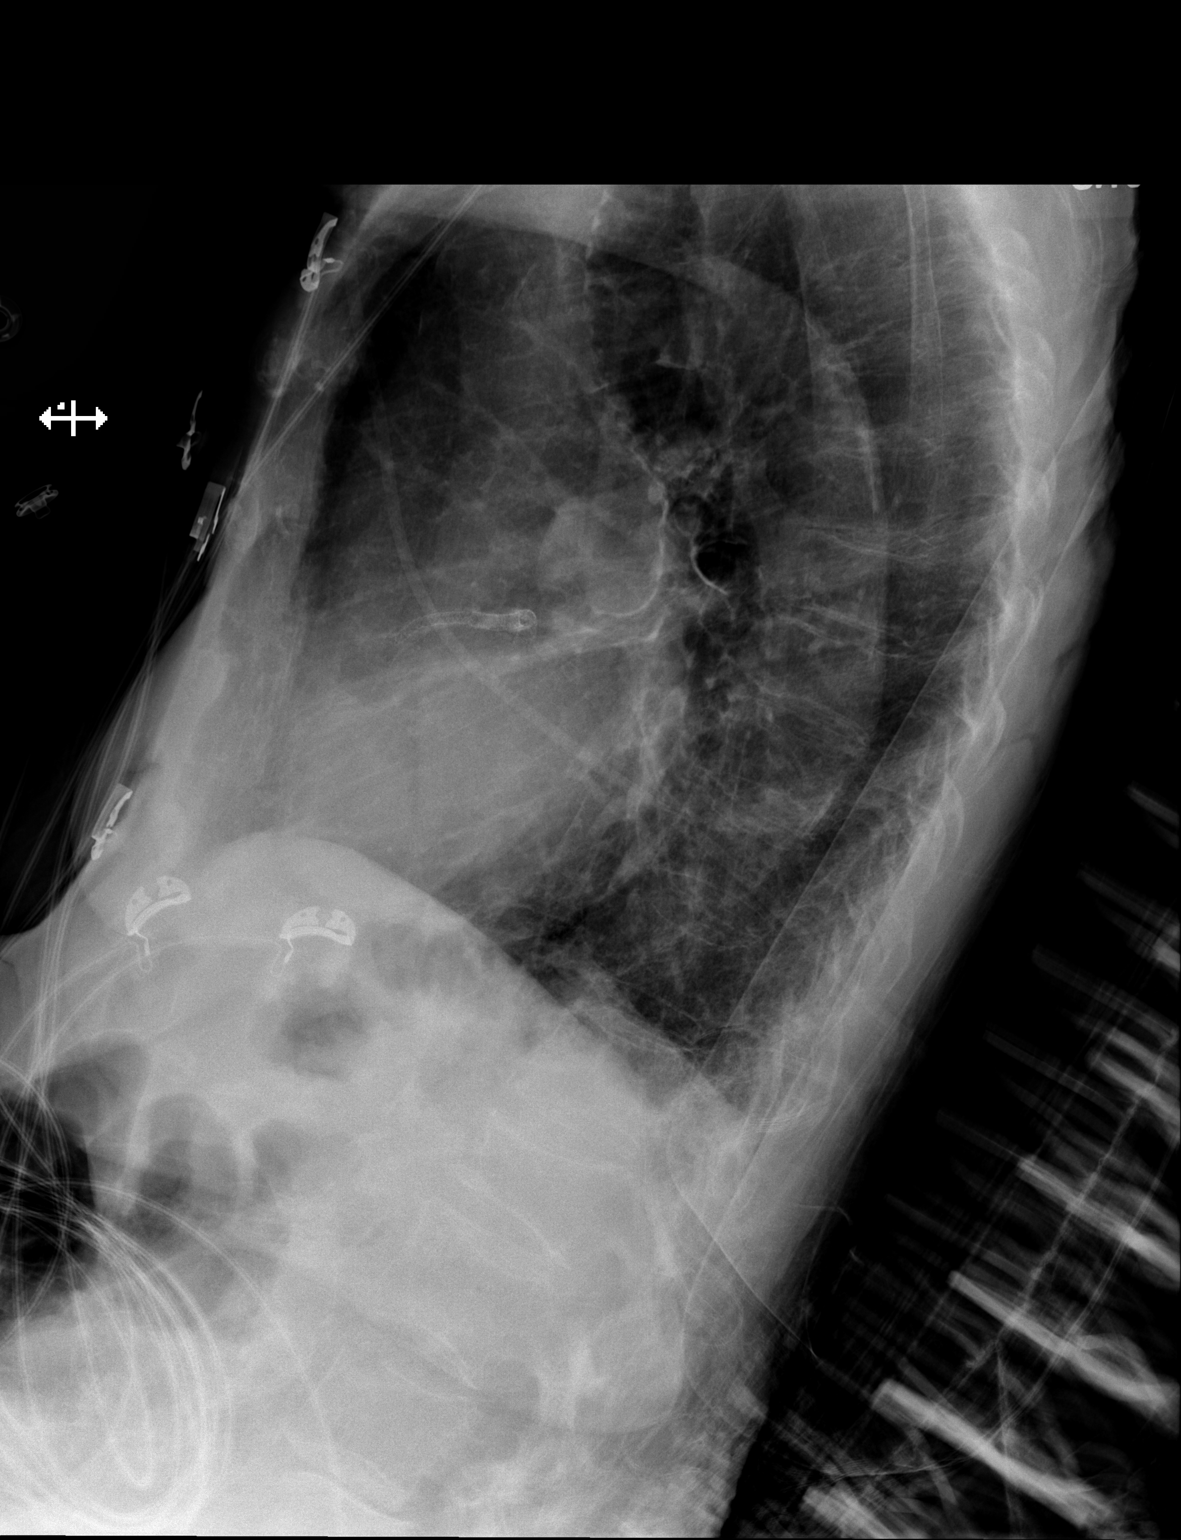

[x chest ap]
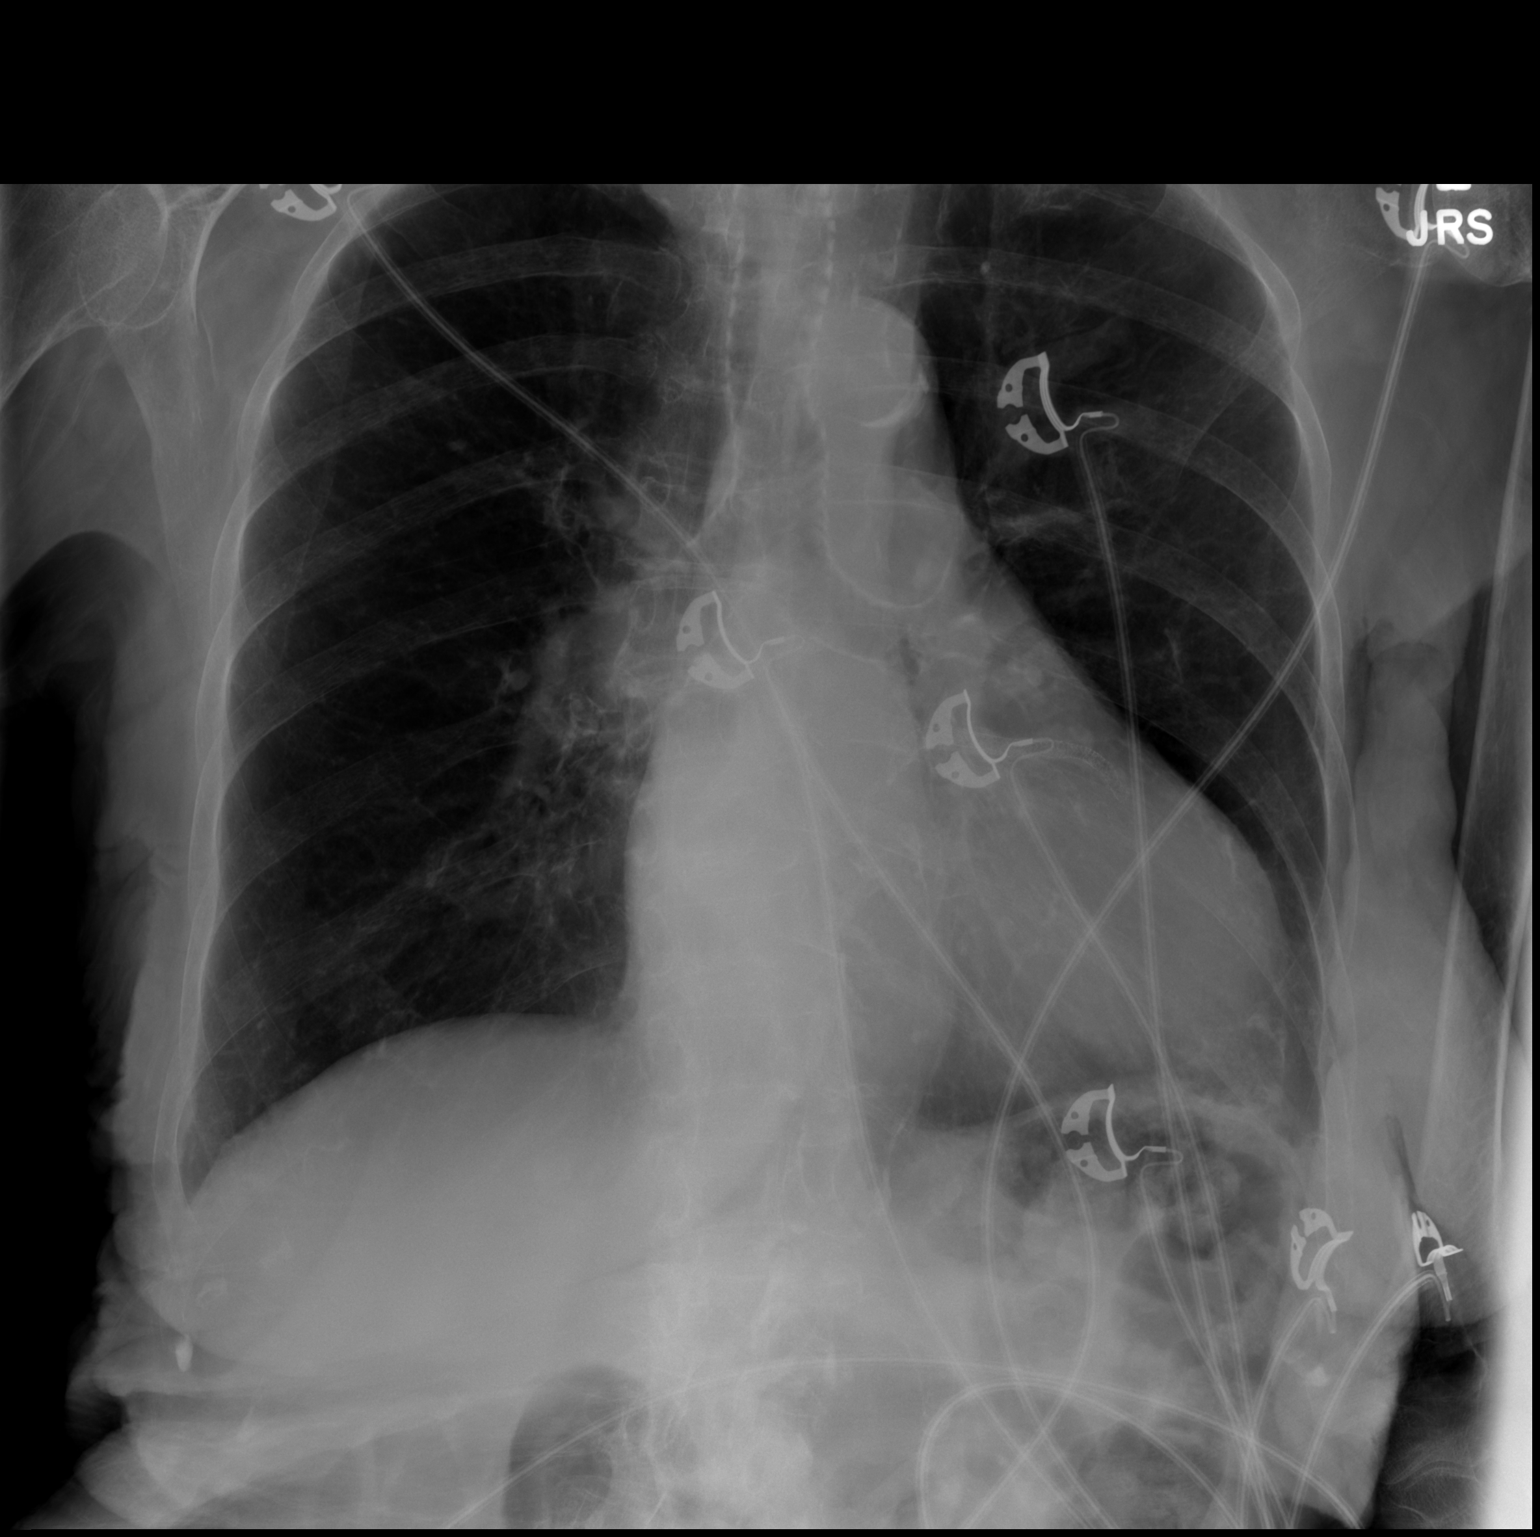

[2 of 2 positions shown; findings below may reference images not displayed]

FINDINGS: Cardiac shadow is within normal limits. Aortic calcifications are
noted. Lungs are well aerated bilaterally. No focal infiltrate is
seen. No bony is noted.
IMPRESSION: No acute abnormality noted.
